# Patient Record
Sex: Male | Born: 1980 | Hispanic: No | Marital: Married | Smoking: Never smoker
Health system: Southern US, Community
[De-identification: ages and names within clinical notes are randomized; demographics above are authoritative.]

## PROBLEM LIST (undated history)

## (undated) HISTORY — PX: NOSE SURGERY: SHX723

---

## 2010-11-04 ENCOUNTER — Emergency Department (HOSPITAL_BASED_OUTPATIENT_CLINIC_OR_DEPARTMENT_OTHER)
Admission: EM | Admit: 2010-11-04 | Discharge: 2010-11-05 | Disposition: A | Discharge status: Home / Self Care | Payer: Commercial Managed Care - PPO | Attending: Emergency Medicine | Admitting: Emergency Medicine

## 2010-11-04 DIAGNOSIS — S022XXA Fracture of nasal bones, initial encounter for closed fracture: Secondary | ICD-10-CM | POA: Insufficient documentation

## 2010-11-04 DIAGNOSIS — S0003XA Contusion of scalp, initial encounter: Secondary | ICD-10-CM | POA: Insufficient documentation

## 2010-11-05 ENCOUNTER — Emergency Department (INDEPENDENT_AMBULATORY_CARE_PROVIDER_SITE_OTHER): Payer: Commercial Managed Care - PPO

## 2010-11-05 DIAGNOSIS — S0083XA Contusion of other part of head, initial encounter: Secondary | ICD-10-CM

## 2010-11-05 DIAGNOSIS — S022XXA Fracture of nasal bones, initial encounter for closed fracture: Secondary | ICD-10-CM

## 2010-11-05 DIAGNOSIS — R040 Epistaxis: Secondary | ICD-10-CM

## 2010-11-05 DIAGNOSIS — S0003XA Contusion of scalp, initial encounter: Secondary | ICD-10-CM

## 2010-11-05 DIAGNOSIS — S1093XA Contusion of unspecified part of neck, initial encounter: Secondary | ICD-10-CM

## 2013-04-06 ENCOUNTER — Emergency Department (HOSPITAL_BASED_OUTPATIENT_CLINIC_OR_DEPARTMENT_OTHER): Payer: Medicaid Other

## 2013-04-06 ENCOUNTER — Encounter (HOSPITAL_BASED_OUTPATIENT_CLINIC_OR_DEPARTMENT_OTHER): Payer: Self-pay | Admitting: Emergency Medicine

## 2013-04-06 ENCOUNTER — Emergency Department (HOSPITAL_BASED_OUTPATIENT_CLINIC_OR_DEPARTMENT_OTHER)
Admission: EM | Admit: 2013-04-06 | Discharge: 2013-04-06 | Disposition: A | Discharge status: Home / Self Care | Payer: Medicaid Other | Attending: Emergency Medicine | Admitting: Emergency Medicine

## 2013-04-06 DIAGNOSIS — J029 Acute pharyngitis, unspecified: Secondary | ICD-10-CM | POA: Insufficient documentation

## 2013-04-06 DIAGNOSIS — J069 Acute upper respiratory infection, unspecified: Secondary | ICD-10-CM | POA: Insufficient documentation

## 2013-04-06 NOTE — ED Notes (Signed)
Patient here with 3 days of cough and sore throat. Reports cough with some mucus, sweating at night.

## 2013-04-06 NOTE — ED Provider Notes (Signed)
CSN: [id number]     Arrival date & time 04/06/13  1347 History  This chart was scribed for Charles B. Bernette Mayers, MD by Leone Payor, ED Scribe. This patient was seen in room MHT13/MHT13 and the patient's care was started 3:13 PM.    Chief Complaint  Patient presents with  . Nasal Congestion    The history is provided by the patient. No language interpreter was used.    HPI Comments: Tony White is a 32 y.o. male who presents to the Emergency Department complaining of 3 days of gradual onset, gradually worsening, constant rhinorrhea, cough and congestion. Pt also reports having associated mild sore throat. He states he was diagnosed with pnumonia a few months ago. He has not taken any OTC medications for his symptoms. He denies postnasal drip, SOB, or fever.   History reviewed. No pertinent past medical history. History reviewed. No pertinent past surgical history. No family history on file. History  Substance Use Topics  . Smoking status: Never Smoker   . Smokeless tobacco: Not on file  . Alcohol Use: Not on file    Review of Systems A complete 10 system review of systems was obtained and all systems are negative except as noted in the HPI and PMH.   Allergies  Review of patient's allergies indicates no known allergies.  Home Medications  No current outpatient prescriptions on file. BP 134/90  Pulse 100  Temp(Src) 99 F (37.2 C) (Oral)  Resp 18  Ht 5\' 9"  (1.753 m)  Wt 228 lb (103.42 kg)  BMI 33.65 kg/m2  SpO2 100% Physical Exam  Nursing note and vitals reviewed. Constitutional: He is oriented to person, place, and time. He appears well-developed and well-nourished.  HENT:  Head: Normocephalic and atraumatic.  Mild posterior pharynx erythema.   Eyes: EOM are normal. Pupils are equal, round, and reactive to light.  Neck: Normal range of motion. Neck supple.  Cardiovascular: Normal rate, regular rhythm, normal heart sounds and intact distal pulses.   Pulmonary/Chest:  Effort normal and breath sounds normal.  Abdominal: Bowel sounds are normal. He exhibits no distension. There is no tenderness.  Musculoskeletal: Normal range of motion. He exhibits no edema and no tenderness.  Neurological: He is alert and oriented to person, place, and time. He has normal strength. No cranial nerve deficit or sensory deficit.  Skin: Skin is warm and dry. No rash noted.  Psychiatric: He has a normal mood and affect.    ED Course  Procedures   DIAGNOSTIC STUDIES: Oxygen Saturation is 100% on RA, normal by my interpretation.    COORDINATION OF CARE: 3:13 PM Discussed treatment plan with pt at bedside and pt agreed to plan.   Labs Review Labs Reviewed - No data to display Imaging Review Dg Chest 2 View  04/06/2013   CLINICAL DATA:  Cough, congestion.  EXAM: CHEST  2 VIEW  COMPARISON:  October 23, 2012.  FINDINGS: The heart size and mediastinal contours are within normal limits. Both lungs are clear. The visualized skeletal structures are unremarkable.  IMPRESSION: No active cardiopulmonary disease.   Electronically Signed   By: Roque Lias M.D.   On: 04/06/2013 14:16    EKG Interpretation   None       MDM   1. Viral URI     Well appearing with mild viral URI symptoms. CXR neg.   I personally performed the services described in this documentation, which was scribed in my presence. The recorded information has been reviewed and is  accurate.      Charles B. Bernette Mayers, MD 04/06/13 1556

## 2013-04-06 NOTE — Discharge Instructions (Signed)

## 2014-08-07 ENCOUNTER — Emergency Department (HOSPITAL_BASED_OUTPATIENT_CLINIC_OR_DEPARTMENT_OTHER): Payer: Commercial Managed Care - PPO

## 2014-08-07 ENCOUNTER — Encounter (HOSPITAL_BASED_OUTPATIENT_CLINIC_OR_DEPARTMENT_OTHER): Payer: Self-pay | Admitting: *Deleted

## 2014-08-07 ENCOUNTER — Emergency Department (HOSPITAL_BASED_OUTPATIENT_CLINIC_OR_DEPARTMENT_OTHER)
Admission: EM | Admit: 2014-08-07 | Discharge: 2014-08-07 | Disposition: A | Discharge status: Home / Self Care | Payer: Commercial Managed Care - PPO | Attending: Emergency Medicine | Admitting: Emergency Medicine

## 2014-08-07 DIAGNOSIS — M79671 Pain in right foot: Secondary | ICD-10-CM | POA: Insufficient documentation

## 2014-08-07 MED ORDER — IBUPROFEN 600 MG PO TABS: 600.0000 mg | ORAL_TABLET | Freq: Four times a day (QID) | ORAL | Status: AC | PRN

## 2014-08-07 MED ORDER — IBUPROFEN 400 MG PO TABS
600.0000 mg | ORAL_TABLET | Freq: Once | ORAL | Status: AC
  Administered 2014-08-07: 600 mg via ORAL
  Filled 2014-08-07 (×2): qty 1

## 2014-08-07 NOTE — ED Notes (Signed)
Pt c/o right foot pain x6 months that has become severe to where he cannot stand on it. Pt does not remember if he injured it or not.

## 2014-08-07 NOTE — ED Provider Notes (Signed)
CSN: [id number]     Arrival date & time 08/07/14  1639 History  This chart was scribed for Pricilla LovelessScott Myrissa Chipley, MD by Evon Slackerrance Branch, ED Scribe. This patient was seen in room MH06/MH06 and the patient's care was started at 6:28 PM.     Chief Complaint  Patient presents with  . Foot Pain   Patient is a 34 y.o. male presenting with lower extremity pain. The history is provided by the patient. No language interpreter was used.  Foot Pain   HPI Comments: Tony White is a 34 y.o. male who presents to the Emergency Department complaining of worsening right foot pain onset 6 months prior. Pt states that the pain is worse when ambulating. Pt states that he does do a lot of walking while at work. Pt also reports wearing steel toe boots at work. Pt denies taking any medications. Pt states that he does try to massage the foot in the morning prior to ambulating. Pt states that after walking the pain is worse when he sits down and rest. Pt denies injury or trauma to the foot. Pt denies any swelling or ankle pain.   History reviewed. No pertinent past medical history. Past Surgical History  Procedure Laterality Date  . Nose surgery     No family history on file. History  Substance Use Topics  . Smoking status: Never Smoker   . Smokeless tobacco: Not on file  . Alcohol Use: Yes    Review of Systems  Musculoskeletal: Positive for arthralgias. Negative for joint swelling and gait problem.  All other systems reviewed and are negative.   Allergies  Review of patient's allergies indicates no known allergies.  Home Medications   Prior to Admission medications   Not on File   BP 148/93 mmHg  Pulse 111  Temp(Src) 98.4 F (36.9 C) (Oral)  Resp 18  Ht 5\' 8"  (1.727 m)  Wt 220 lb (99.791 kg)  BMI 33.46 kg/m2  SpO2 98%   Physical Exam  Constitutional: He is oriented to person, place, and time. He appears well-developed and well-nourished. No distress.  HENT:  Head: Normocephalic and atraumatic.   Eyes: Conjunctivae and EOM are normal.  Neck: Neck supple. No tracheal deviation present.  Cardiovascular: Normal rate.   Pulses:      Dorsalis pedis pulses are 2+ on the right side.  Pulmonary/Chest: Effort normal. No respiratory distress.  Musculoskeletal: He exhibits tenderness.       Right foot: There is tenderness. There is normal range of motion, no swelling and normal capillary refill.       Feet:  Neurological: He is alert and oriented to person, place, and time.  Skin: Skin is warm and dry.  Psychiatric: He has a normal mood and affect. His behavior is normal.  Nursing note and vitals reviewed.   ED Course  Procedures (including critical care time) DIAGNOSTIC STUDIES: Oxygen Saturation is 98% on RA, normal by my interpretation.    COORDINATION OF CARE: 6:48 PM-Discussed treatment plan with pt at bedside and pt agreed to plan.     Labs Review Labs Reviewed - No data to display  Imaging Review Dg Foot Complete Right  08/07/2014   CLINICAL DATA:  Right foot pain for 6 months, no known injury  EXAM: RIGHT FOOT COMPLETE - 3+ VIEW  COMPARISON:  None.  FINDINGS: Three views of the right foot submitted. No acute fracture or subluxation. No radiopaque foreign body. No periosteal reaction or bony erosion.  IMPRESSION: Negative.  Electronically Signed   By: Natasha Mead M.D.   On: 08/07/2014 17:13     EKG Interpretation None      MDM   Final diagnoses:  Right foot pain    No obvious etiology for the patient's right foot pain. Given the chronicity, I have low suspicion for an acute infection or septic joint. His pain is more lateral and not over a joint space. There is no erythema. Will refer to orthopedics given his chronic orthopedic pain. Has not tried any pain medicines at all, will recommend ibuprofen and Tylenol. Could be related to his work boots causing a compression effect.  I personally performed the services described in this documentation, which was scribed in  my presence. The recorded information has been reviewed and is accurate.     Pricilla Loveless, MD 08/08/14 [facility] Manners

## 2016-06-14 IMAGING — CR DG FOOT COMPLETE 3+V*R*
3 series · 3 of 3 positions shown · non-contrast
Comparison: None.

CLINICAL DATA: Right foot pain for 6 months, no known injury

EXAM:
RIGHT FOOT COMPLETE - 3+ VIEW

[t foot ap right]
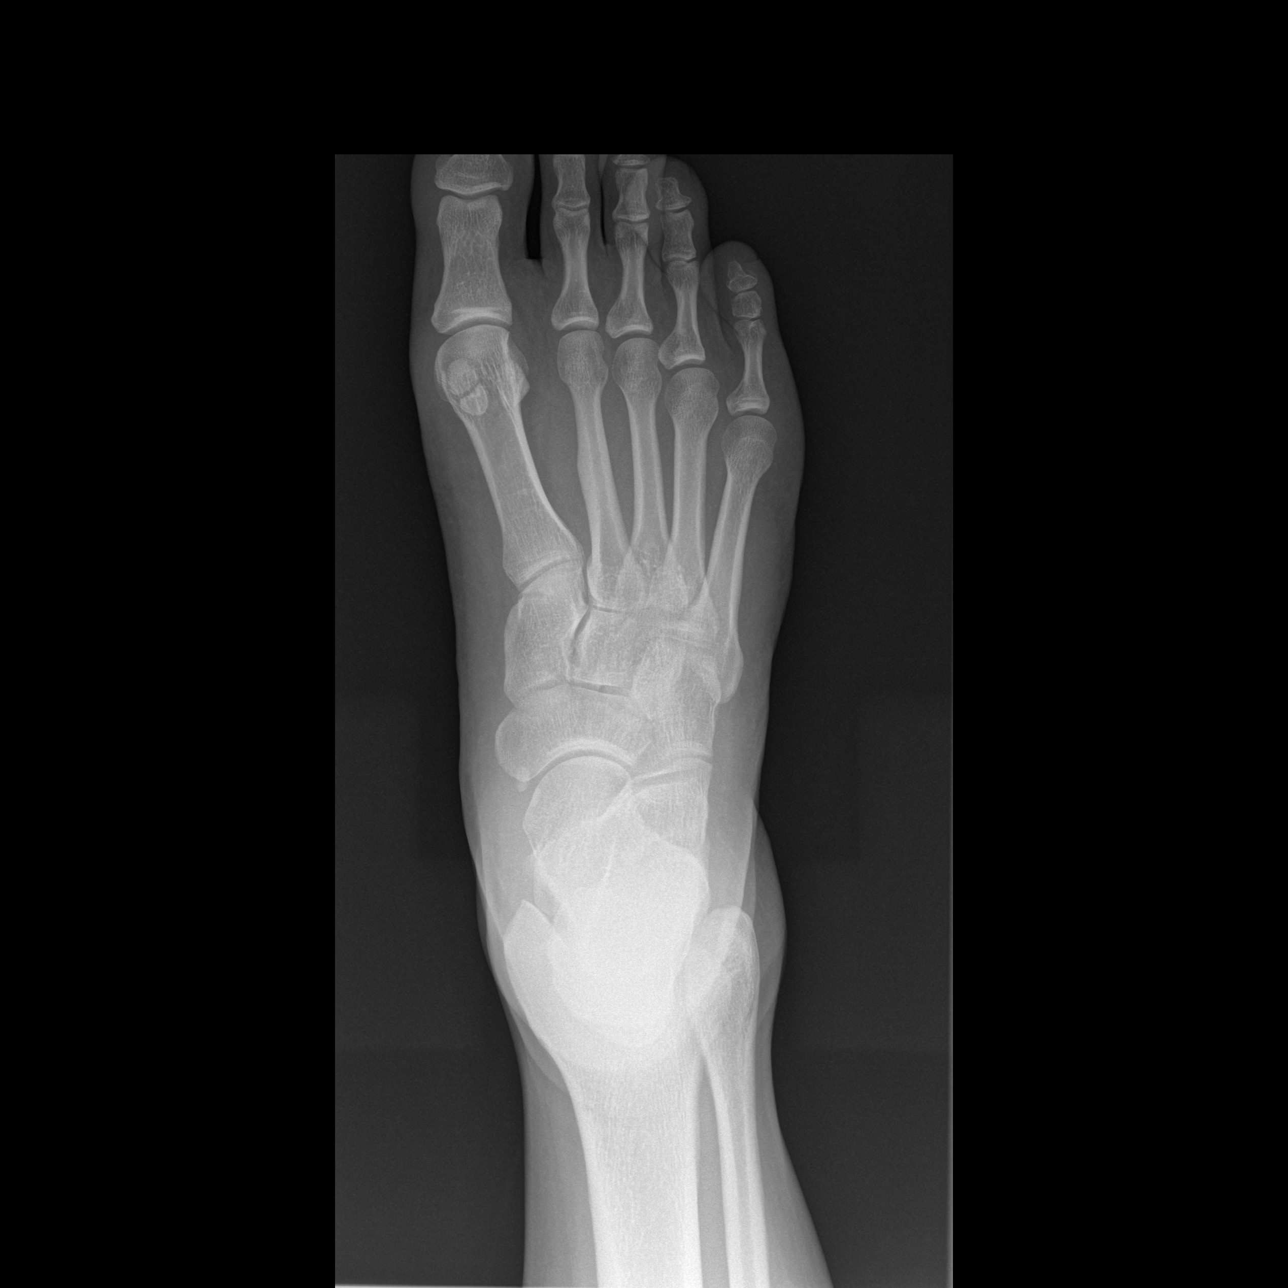

[t foot oblique right]
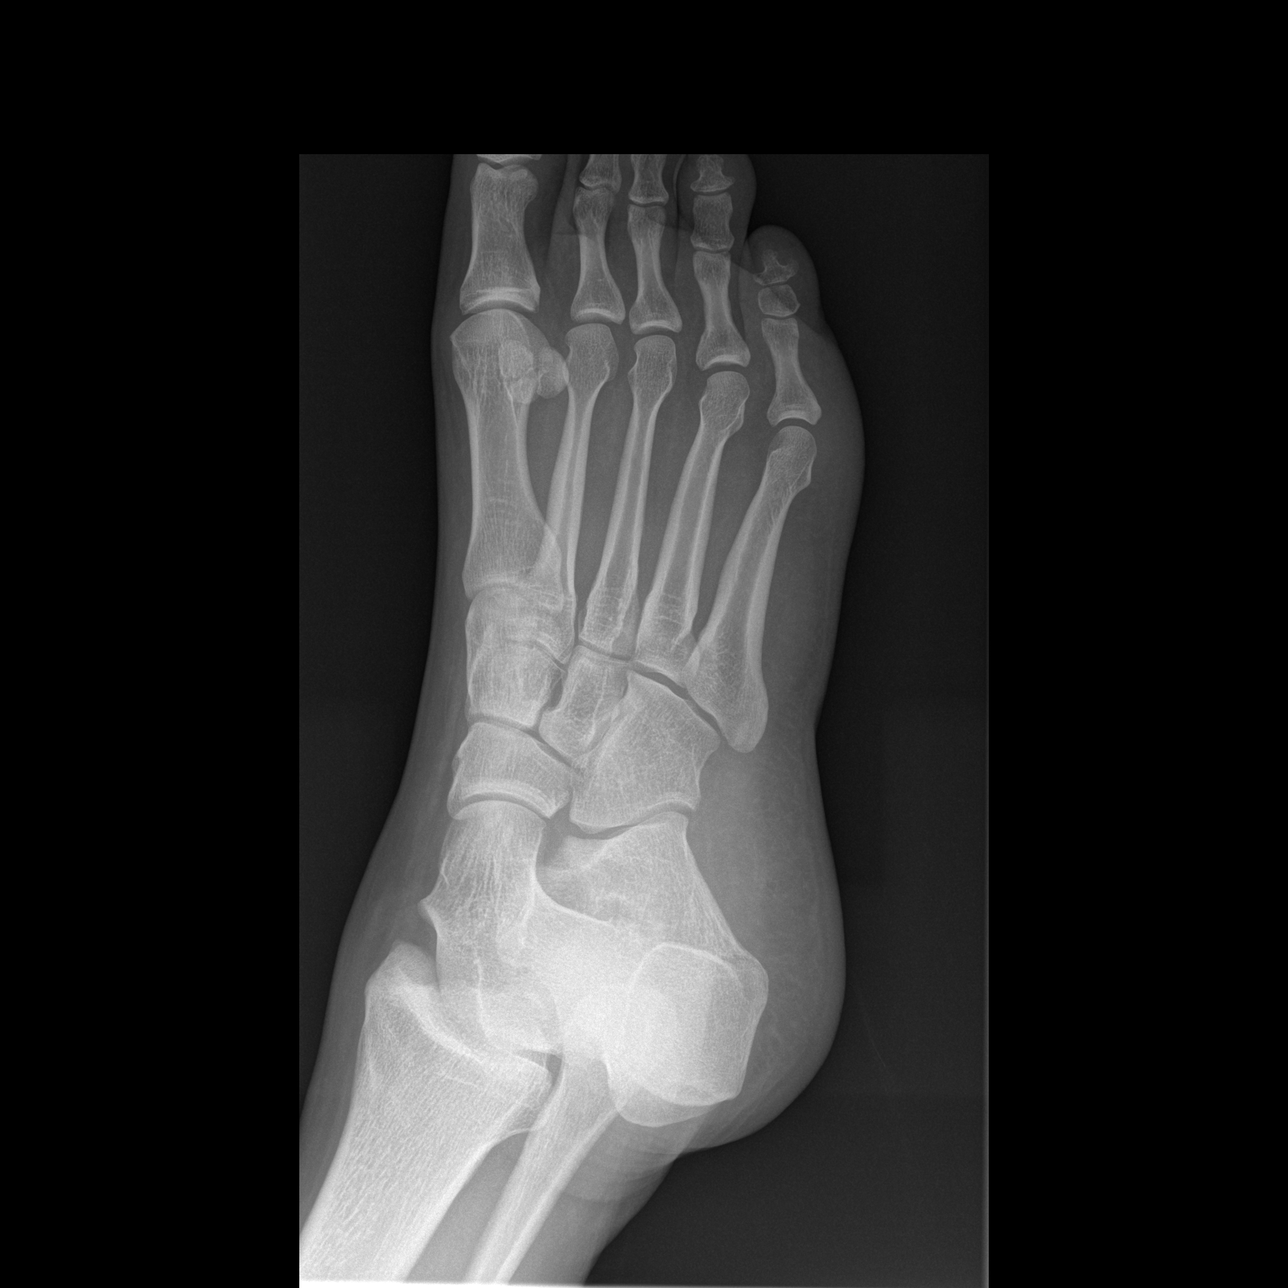

[t foot lat right]
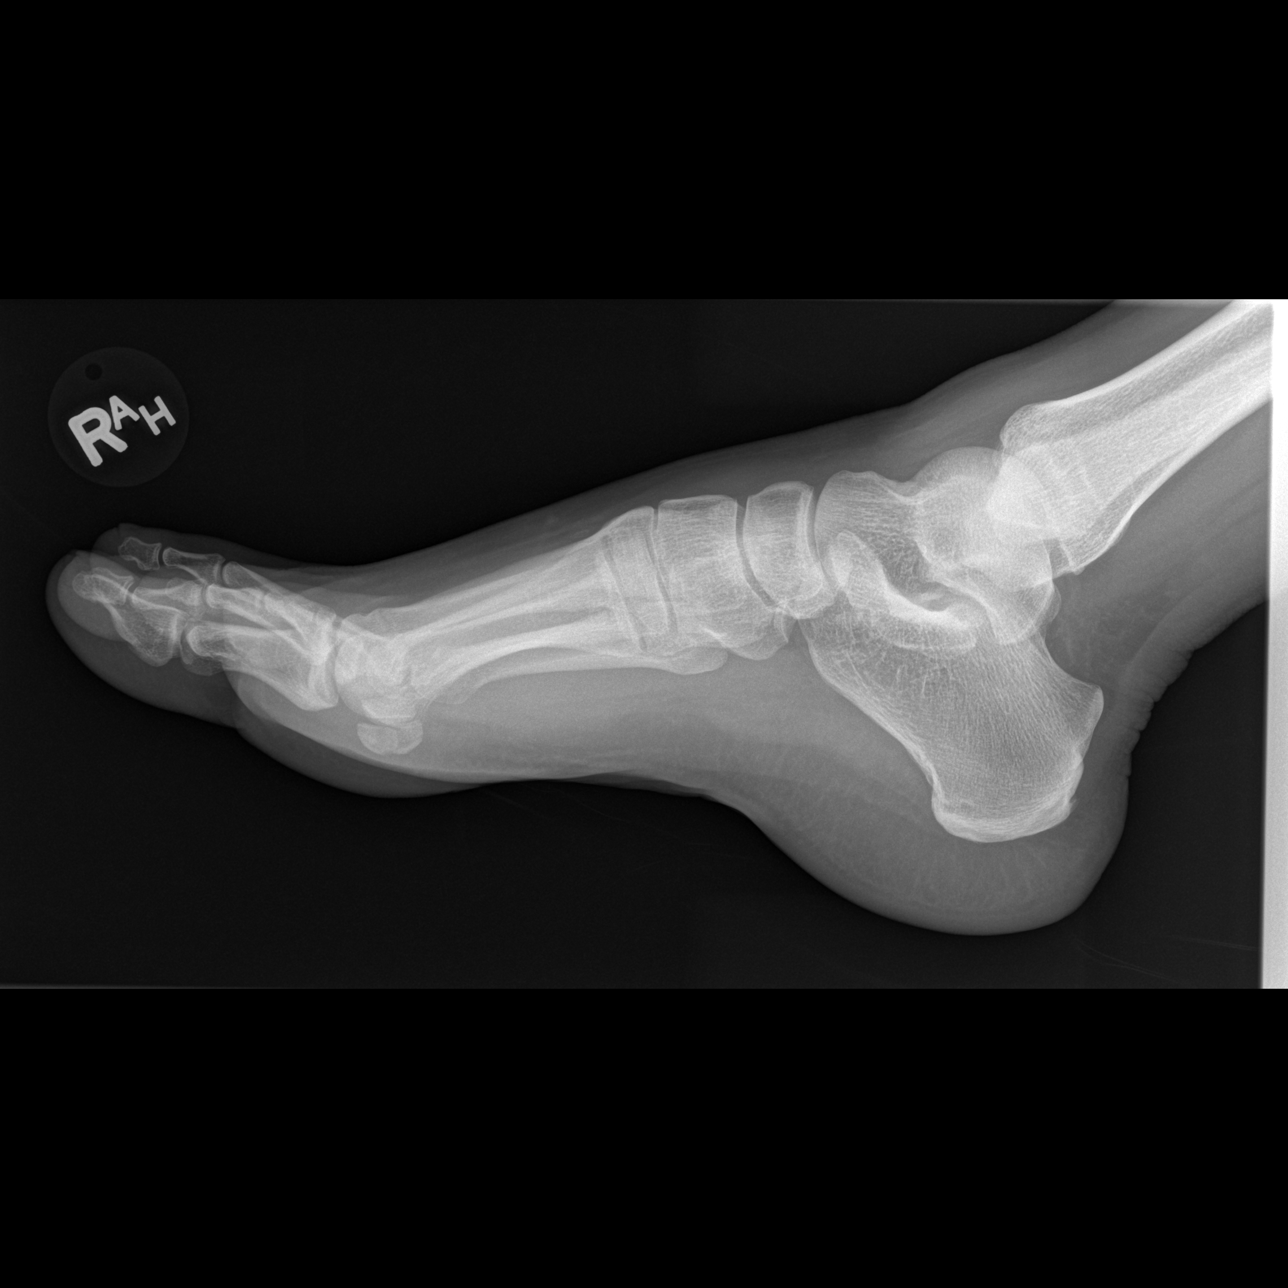

[3 of 3 positions shown; findings below may reference images not displayed]

FINDINGS: Three views of the right foot submitted. No acute fracture or
subluxation. No radiopaque foreign body. No periosteal reaction or
bony erosion.
IMPRESSION: Negative.
